# Patient Record
Sex: Male | Born: 1990 | Race: White | Hispanic: No | Marital: Single | Smoking: Never smoker
Health system: Southern US, Community
[De-identification: ages and names within clinical notes are randomized; demographics above are authoritative.]

## PROBLEM LIST (undated history)

## (undated) DIAGNOSIS — J45909 Unspecified asthma, uncomplicated: Secondary | ICD-10-CM

---
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
## 2015-01-26 ENCOUNTER — Encounter (HOSPITAL_COMMUNITY): Payer: Self-pay | Admitting: *Deleted

## 2015-01-26 ENCOUNTER — Emergency Department (HOSPITAL_COMMUNITY)
Admission: EM | Admit: 2015-01-26 | Discharge: 2015-01-26 | Disposition: A | Discharge status: Home / Self Care | Payer: Self-pay | Attending: Emergency Medicine | Admitting: Emergency Medicine

## 2015-01-26 ENCOUNTER — Emergency Department (HOSPITAL_COMMUNITY): Payer: Self-pay

## 2015-01-26 DIAGNOSIS — S86911A Strain of unspecified muscle(s) and tendon(s) at lower leg level, right leg, initial encounter: Secondary | ICD-10-CM | POA: Insufficient documentation

## 2015-01-26 DIAGNOSIS — J45909 Unspecified asthma, uncomplicated: Secondary | ICD-10-CM | POA: Insufficient documentation

## 2015-01-26 DIAGNOSIS — S8391XA Sprain of unspecified site of right knee, initial encounter: Secondary | ICD-10-CM | POA: Insufficient documentation

## 2015-01-26 DIAGNOSIS — Y998 Other external cause status: Secondary | ICD-10-CM | POA: Insufficient documentation

## 2015-01-26 DIAGNOSIS — Y9289 Other specified places as the place of occurrence of the external cause: Secondary | ICD-10-CM | POA: Insufficient documentation

## 2015-01-26 DIAGNOSIS — Y9372 Activity, wrestling: Secondary | ICD-10-CM | POA: Insufficient documentation

## 2015-01-26 DIAGNOSIS — X503XXA Overexertion from repetitive movements, initial encounter: Secondary | ICD-10-CM | POA: Insufficient documentation

## 2015-01-26 DIAGNOSIS — M25561 Pain in right knee: Secondary | ICD-10-CM

## 2015-01-26 HISTORY — DX: Unspecified asthma, uncomplicated: J45.909

## 2015-01-26 NOTE — ED Provider Notes (Signed)
CSN: [id number]     Arrival date & time 01/26/15  1201 History  By signing my name below, I, Jason James, attest that this documentation has been prepared under the direction and in the presence of [employer], [employer] Electronically Signed: Soijett James, ED Scribe. 01/26/2015. 12:41 PM.   Chief Complaint  Patient presents with  . Knee Pain      Patient is a 24 y.o. male presenting with knee pain. The history is provided by the patient. No language interpreter was used.  Knee Pain Location:  Knee Time since incident:  1 day Injury: yes   Mechanism of injury comment:  Forced flexion/adduction during wrestling match Knee location:  R knee Pain details:    Quality:  Dull and aching   Radiates to:  Does not radiate   Severity:  Moderate   Onset quality:  Sudden   Duration:  1 day   Timing:  Constant   Progression:  Unchanged Chronicity:  New Dislocation: no   Foreign body present:  Unable to specify Tetanus status:  Unknown Prior injury to area:  Yes (several years ago when someone landed on his right knee that resolved on its own) Relieved by:  Nothing Worsened by:  Bearing weight Ineffective treatments:  Acetaminophen Associated symptoms: decreased ROM (due to pain) and swelling   Associated symptoms: no fever, no muscle weakness, no numbness and no tingling     Jason James is a 24 y.o. male  who presents to the Emergency Department complaining of right knee pain onset yesterday. He notes that he was wrestling yesterday and the other wrestler forcibly flexed and adducted his knee at which time he felt something "pop" and he now has pain and swelling. He notes that he has injured his knee several years ago and his pain resolved with rest but he was not seen for his symptoms. Pt describes his right knee pain as 8/10, constant, dull/achy, and it does not radiate. He states that weight bearing or movement worsens his pain. He reports that he has tried tylenol and BC  powder with no relief for his symptoms. Pt is having associated symptoms of mild right knee swelling and loss of ROM due to pain. Pt denies redness, warmth, bruising, numbness, tingling, weakness, CP, SOB, abdominal pain, n/v/d/c, fever, chills, dysuria, hematuria, and any other symptoms.    Past Medical History  Diagnosis Date  . Asthma    History reviewed. No pertinent past surgical history. History reviewed. No pertinent family history. Social History  Substance Use Topics  . Smoking status: Never Smoker   . Smokeless tobacco: None  . Alcohol Use: No    Review of Systems  Constitutional: Negative for fever and chills.  Respiratory: Negative for shortness of breath.   Cardiovascular: Negative for chest pain.  Gastrointestinal: Negative for nausea, vomiting, abdominal pain, diarrhea and constipation.  Genitourinary: Negative for dysuria and hematuria.  Musculoskeletal: Positive for joint swelling and arthralgias.  Skin: Negative for color change and wound.  Allergic/Immunologic: Negative for immunocompromised state.  Neurological: Negative for weakness and numbness.   10 Systems reviewed and all are negative for acute change except as noted in the HPI.   Allergies  Review of patient's allergies indicates no known allergies.  Home Medications   Prior to Admission medications   Not on File   BP 158/90 mmHg  Pulse 105  Temp(Src) 98.6 F (37 C) (Oral)  Resp 18  SpO2 98% Physical Exam  Constitutional: He is oriented to  person, place, and time. Vital signs are normal. He appears well-developed and well-nourished.  Non-toxic appearance. No distress.  Afebrile, nontoxic, NAD  HENT:  Head: Normocephalic and atraumatic.  Mouth/Throat: Mucous membranes are normal.  Eyes: Conjunctivae and EOM are normal. Right eye exhibits no discharge. Left eye exhibits no discharge.  Neck: Normal range of motion. Neck supple.  Cardiovascular: Normal rate.   Pulmonary/Chest: Effort normal.  No respiratory distress.  Abdominal: Normal appearance. He exhibits no distension.  Musculoskeletal:       Right knee: He exhibits decreased range of motion (due to pain). He exhibits no swelling, no effusion, no ecchymosis, no deformity, no erythema, normal alignment, no LCL laxity, normal patellar mobility and no MCL laxity. Tenderness found. Medial joint line tenderness noted.  Right knee with limited ROM due to pain, medial joint line TTP, no swelling/effusion/deformity, no bruising, no abnormal alignment or patellar mobility, no varus/valgus laxity, neg anterior drawer test, no crepitus. strength and sensation grossly intact. Distal pulses intact.  Neurological: He is alert and oriented to person, place, and time. He has normal strength. No sensory deficit.  Skin: Skin is warm, dry and intact. No rash noted.  Psychiatric: He has a normal mood and affect.  Nursing note and vitals reviewed.   ED Course  Procedures (including critical care time) DIAGNOSTIC STUDIES: Oxygen Saturation is 98% on RA, nl by my interpretation.    COORDINATION OF CARE: 12:35 PM Discussed treatment plan with pt at bedside which includes right knee xray and pt agreed to plan.    Labs Review Labs Reviewed - No data to display  Imaging Review Dg Knee Complete 4 Views Right  01/26/2015  CLINICAL DATA:  Right knee pain, wrestling injury EXAM: RIGHT KNEE - COMPLETE 4+ VIEW COMPARISON:  None. FINDINGS: No fracture or dislocation is seen. The joint spaces are preserved. The visualized soft tissues are unremarkable. No suprapatellar knee joint effusion. IMPRESSION: Negative. Electronically Signed   By: Charline BillsSriyesh  Krishnan M.D.   On: 01/26/2015 13:18   I have personally reviewed and evaluated these images as part of my medical decision-making.   EKG Interpretation None      MDM   Final diagnoses:  Knee pain, acute, right  Knee sprain and strain, right, initial encounter    24 y.o. male here with R knee pain  after forced flexion/adduction. NVI with soft compartments. Tenderness to medial joint line. Will obtain xray imaging. Pt declines pain meds. Will reassess after xray.  1:23 PM Xray neg. Will place in knee immobilizer to treat for sprain, discussed RICE therapy, f/up with ortho in 1-2wks. Pt has crutches with him, no need for these today. Tylenol/motrin for pain. I explained the diagnosis and have given explicit precautions to return to the ER including for any other new or worsening symptoms. The patient understands and accepts the medical plan as it's been dictated and I have answered their questions. Discharge instructions concerning home care and prescriptions have been given. The patient is STABLE and is discharged to home in good condition.   I personally performed the services described in this documentation, which was scribed in my presence. The recorded information has been reviewed and is accurate.    BP 158/90 mmHg  Pulse 105  Temp(Src) 98.6 F (37 C) (Oral)  Resp 18  SpO2 98%  No orders of the defined types were placed in this encounter.     Allen DerryMercedes Camprubi-Soms, PA-C 01/26/15 1329  Laurence Spatesachel Morgan Little, MD 01/27/15 [phone]

## 2015-01-26 NOTE — Discharge Instructions (Signed)
Wear knee immobilizer for at least 2 weeks for stabilization of knee. Use crutches as needed for comfort. Ice and elevate knee throughout the day. Alternate between tylenol or motrin as needed for pain. Call orthopedic follow up today or tomorrow to schedule followup appointment for recheck of ongoing knee pain in one to two weeks that can be canceled with a 24-48 hour notice if complete resolution of pain. Return to the ER for changes or worsening symptoms.   Knee Sprain A knee sprain is a tear in the strong bands of tissue that connect the bones (ligaments) of your knee. HOME CARE  Raise (elevate) your injured knee to lessen puffiness (swelling).  To ease pain and puffiness, put ice on the injured area.  Put ice in a plastic bag.  Place a towel between your skin and the bag.  Leave the ice on for 20 minutes, 2-3 times a day.  Only take medicine as told by your doctor.  Do not leave your knee unprotected until pain and stiffness go away (usually 4-6 weeks).  If you have a cast or splint, do not get it wet. If your doctor told you to not take it off, cover it with a plastic bag when you shower or bathe. Do not swim.  Your doctor may have you do exercises to prevent or limit permanent weakness and stiffness. GET HELP RIGHT AWAY IF:   Your cast or splint becomes damaged.  Your pain gets worse.  You have a lot of pain, puffiness, or numbness below the cast or splint. MAKE SURE YOU:   Understand these instructions.  Will watch your condition.  Will get help right away if you are not doing well or get worse.   This information is not intended to replace advice given to you by your health care provider. Make sure you discuss any questions you have with your health care provider.   Document Released: 02/18/2009 Document Revised: 03/07/2013 Document Reviewed: 11/08/2012 Elsevier Interactive Patient Education 2016 [employer]  Cryotherapy Cryotherapy is when you put ice on your  injury. Ice helps lessen pain and puffiness (swelling) after an injury. Ice works the best when you start using it in the first 24 to 48 hours after an injury. HOME CARE  Put a dry or damp towel between the ice pack and your skin.  You may press gently on the ice pack.  Leave the ice on for no more than 10 to 20 minutes at a time.  Check your skin after 5 minutes to make sure your skin is okay.  Rest at least 20 minutes between ice pack uses.  Stop using ice when your skin loses feeling (numbness).  Do not use ice on someone who cannot tell you when it hurts. This includes small children and people with memory problems (dementia). GET HELP RIGHT AWAY IF:  You have white spots on your skin.  Your skin turns blue or pale.  Your skin feels waxy or hard.  Your puffiness gets worse. MAKE SURE YOU:   Understand these instructions.  Will watch your condition.  Will get help right away if you are not doing well or get worse.   This information is not intended to replace advice given to you by your health care provider. Make sure you discuss any questions you have with your health care provider.   Document Released: 08/19/2007 Document Revised: 05/25/2011 Document Reviewed: 10/23/2010 Elsevier Interactive Patient Education [employer]

## 2015-01-26 NOTE — ED Notes (Signed)
Pt reports wrestling yesterday, felt something "pop" right knee and now has pain and swelling.

## 2016-09-04 IMAGING — CR DG KNEE COMPLETE 4+V*R*
4 series · 4 of 4 positions shown · non-contrast
Comparison: None.

CLINICAL DATA: Right knee pain, wrestling injury

EXAM:
RIGHT KNEE - COMPLETE 4+ VIEW

[knee ap]
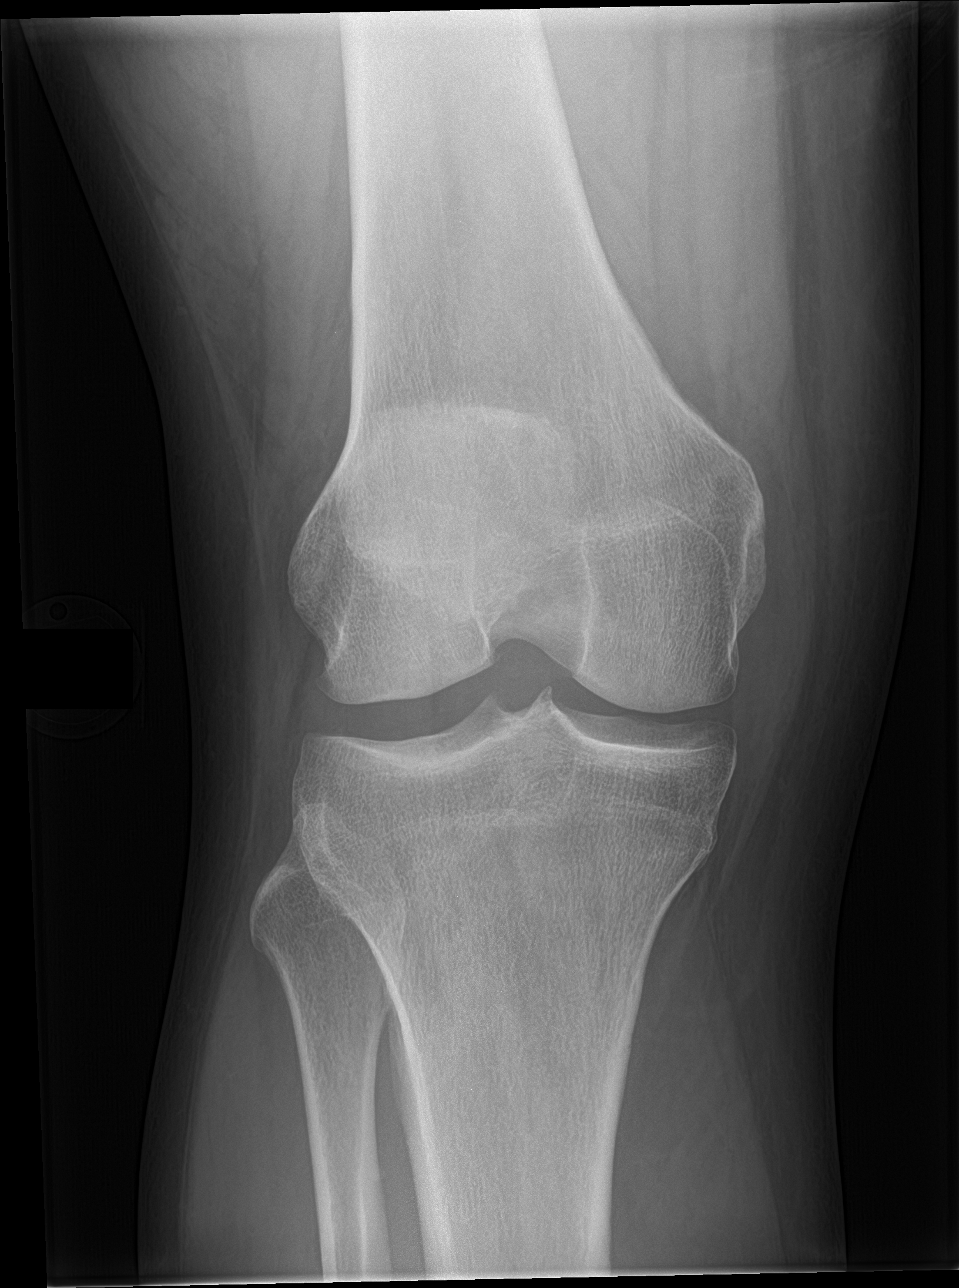

[knee lat]
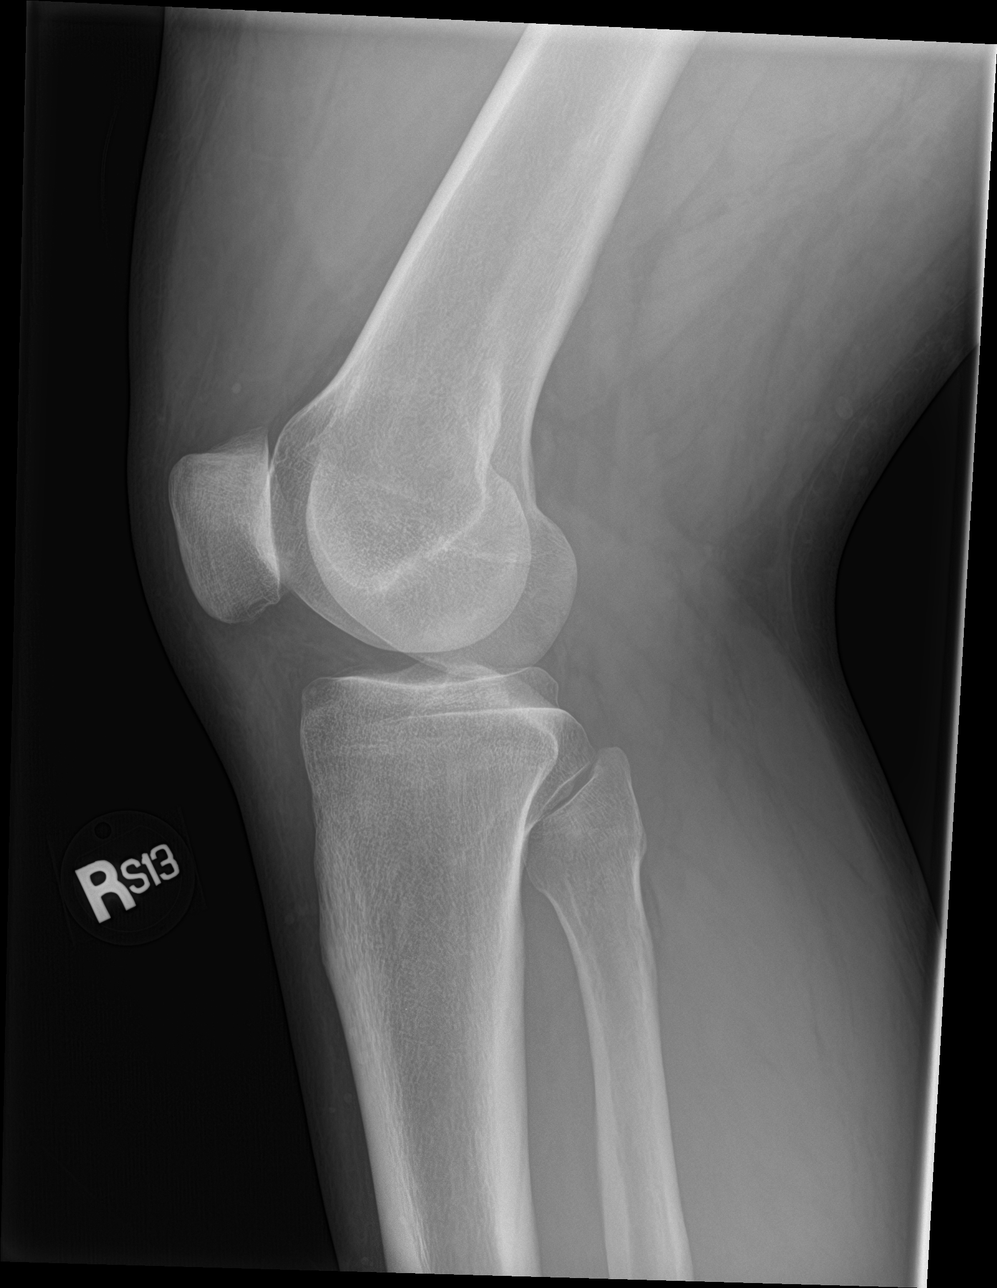

[knee obl (1 of 2)]
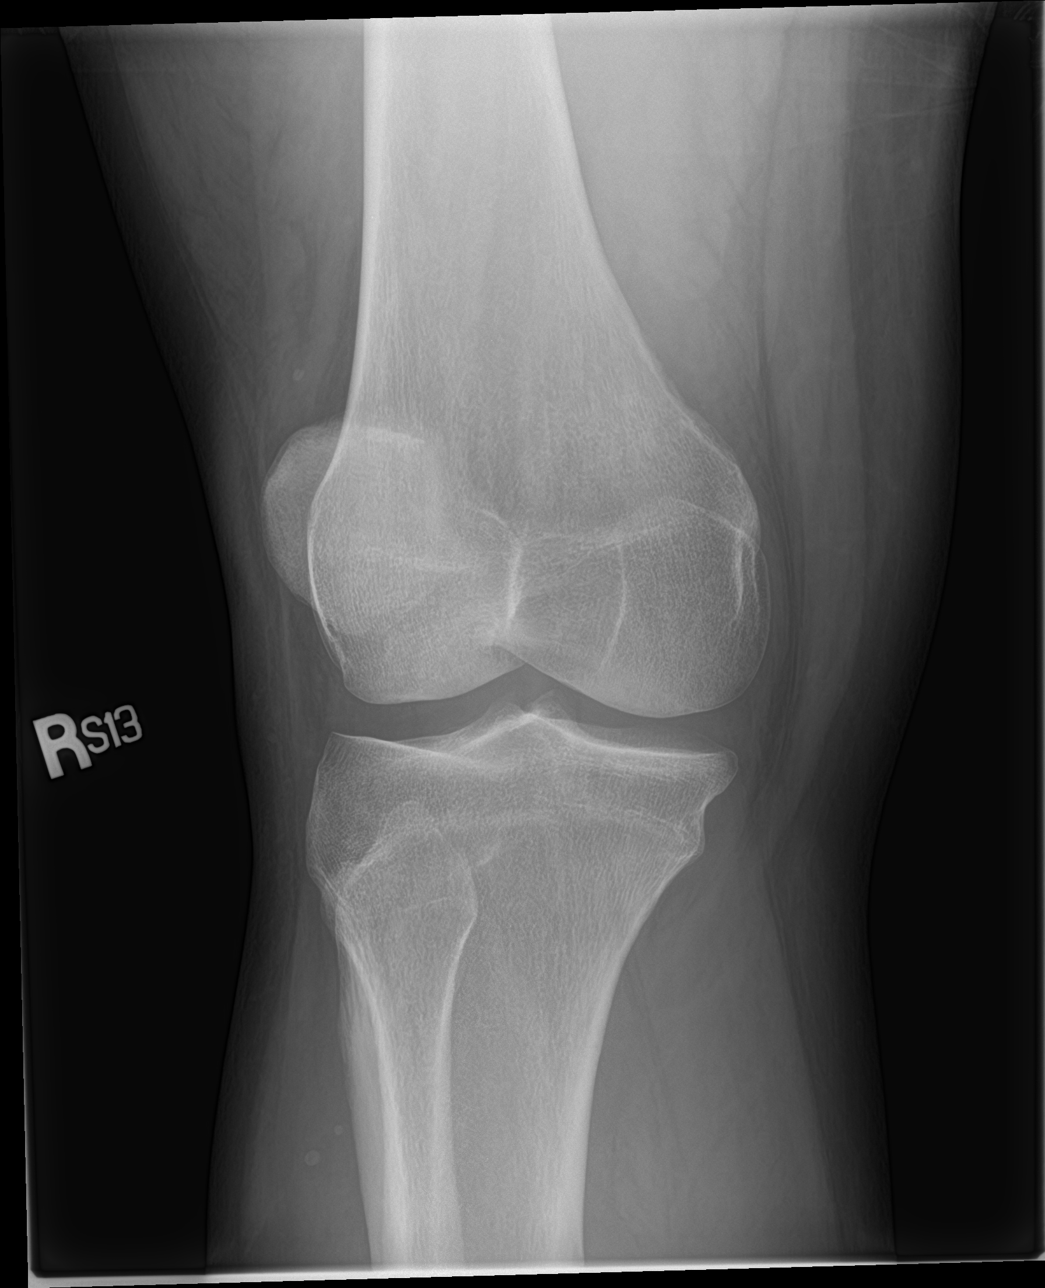

[knee obl (2 of 2)]
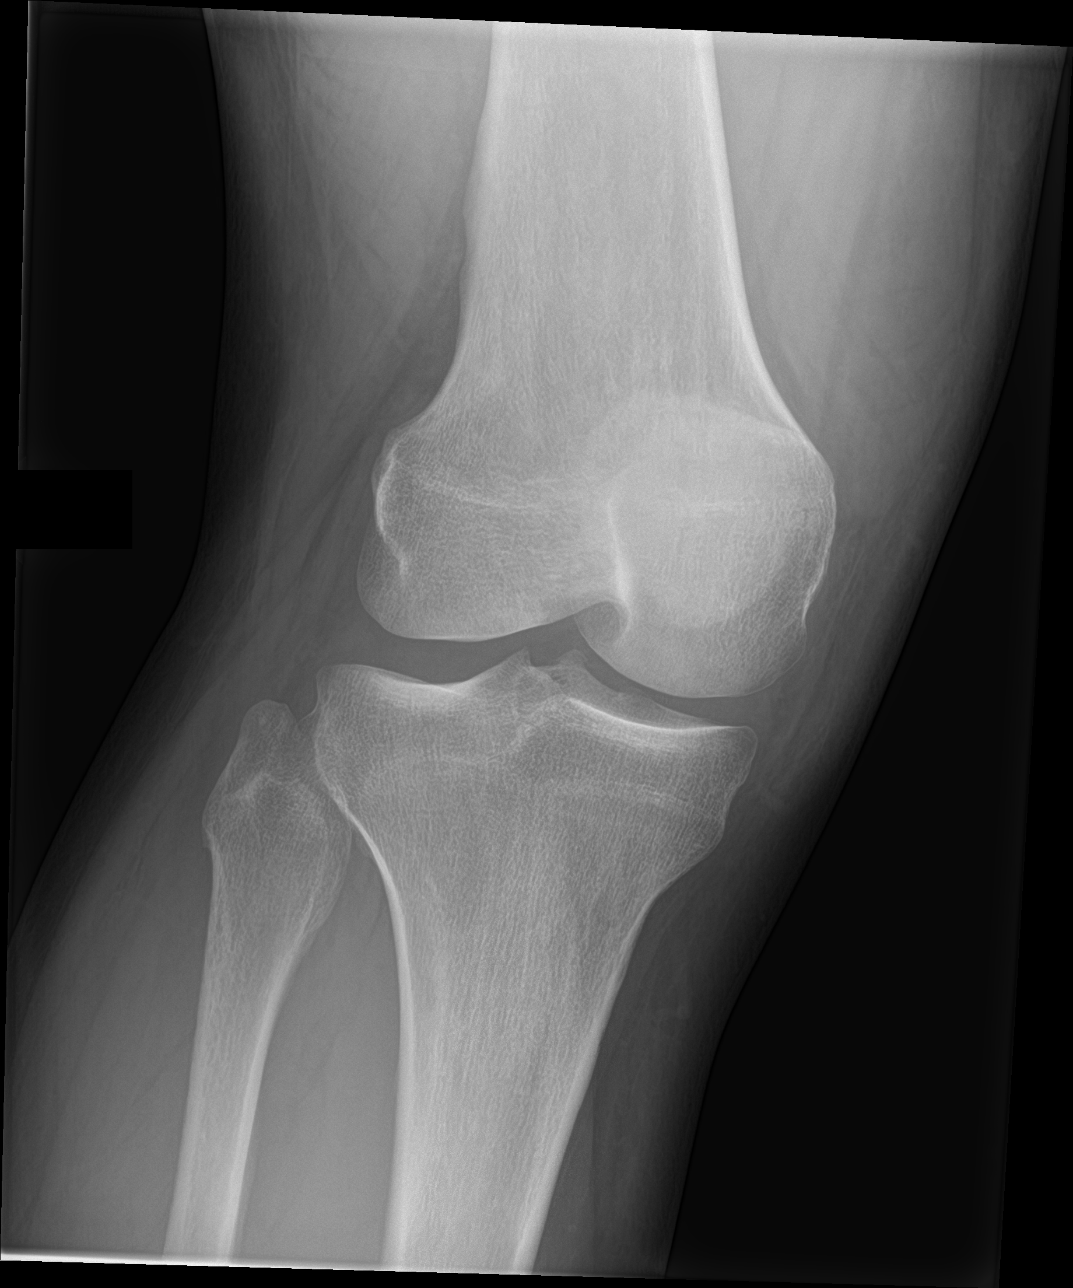

[4 of 4 positions shown; findings below may reference images not displayed]

FINDINGS: No fracture or dislocation is seen.

The joint spaces are preserved.

The visualized soft tissues are unremarkable.

No suprapatellar knee joint effusion.
IMPRESSION: Negative.

## 2018-06-09 ENCOUNTER — Telehealth: Payer: Self-pay | Admitting: Internal Medicine

## 2018-06-09 NOTE — Telephone Encounter (Signed)
Pt called  L/m that his roommate has tested positive for covid , and want get tested and cleared for work,he didn't mention any sign of symptoms on voicemail.
